# Patient Record
Sex: Female | Born: 1995 | Race: White | Hispanic: No | Marital: Single | Smoking: Never smoker
Health system: Southern US, Community
[De-identification: ages and names within clinical notes are randomized; demographics above are authoritative.]

## PROBLEM LIST (undated history)

## (undated) DIAGNOSIS — D690 Allergic purpura: Secondary | ICD-10-CM

## (undated) HISTORY — DX: Allergic purpura: D69.0

## (undated) HISTORY — PX: RENAL BIOPSY: SHX156

---
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
## 2017-01-16 ENCOUNTER — Encounter: Payer: Self-pay | Admitting: Obstetrics and Gynecology

## 2017-01-16 ENCOUNTER — Ambulatory Visit (INDEPENDENT_AMBULATORY_CARE_PROVIDER_SITE_OTHER): Payer: Managed Care, Other (non HMO) | Admitting: Obstetrics and Gynecology

## 2017-01-16 VITALS — BP 92/60 | HR 80 | Resp 16 | Ht 61.5 in | Wt 133.0 lb

## 2017-01-16 DIAGNOSIS — N946 Dysmenorrhea, unspecified: Secondary | ICD-10-CM

## 2017-01-16 DIAGNOSIS — Z3009 Encounter for other general counseling and advice on contraception: Secondary | ICD-10-CM

## 2017-01-16 MED ORDER — LEVONORGESTREL-ETHINYL ESTRAD 0.15-30 MG-MCG PO TABS: 1.0000 | ORAL_TABLET | Freq: Every day | ORAL | 3 refills | Status: DC

## 2017-01-16 NOTE — Progress Notes (Signed)
21 y.o. G0P0000 Single Caucasian female here as a new patient for annual exam and discuss birth control. Patient was taking Lutera - levonogestrel/ethinyl estradiol. (Alesse) This was prescribed by Dr. Jeanella Craze in [place].   Mother present for part of the discussion today.  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  There was a suspicion for PCOS.  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  Had ankle swelling, rash and joint pain.   Menses regular with OCPs. Still had some cramping. This was manageable. Had breakthrough bleeding when she took the pills continuously. Cycles were otherwise 21 - 81 days.  Unable to take NSAIDs due to he HSP.   Never sexually active but wants to be prepared when she goes off to college.  FH atrial fibrillation.   Sr. In HS.  Interested in Doctor, general practice.   PCP:  Dr. Loyola Mast - [facility]  Patient's last menstrual period was 12/25/2016.     Period Cycle (Days):  (irregular periods before taking Lutera OCP) Period Duration (Days): 6 Period Pattern: (!) Irregular Menstrual Flow: Moderate Menstrual Control: Panty liner, Tampon Menstrual Control Change Freq (Hours): 4-5 Dysmenorrhea: (!) Moderate     Sexually active: No.  The current method of family planning is abstinence.  Never sexually active.  Exercising: Yes.    gym Smoker:  no  Health Maintenance: Pap:  n/a History of abnormal Pap:  n/a MMG:  n/a Colonoscopy:  n/a BMD:   n/a TDaP:  UTD per patient Gardasil:   No.  Received the first dosage only as her HSP was diagnosed around the same time after the first dosage.    Screening Labs:  Discuss today Urine today: dirty urine obtained   reports that she has never smoked. She has never used smokeless tobacco. She reports that she does not drink alcohol or use drugs.  Past Medical History:  Diagnosis Date  . HSP (Henoch Schonlein purpura) (HCC)      Past Surgical History:  Procedure Laterality Date  . RENAL BIOPSY Left     Current Outpatient Prescriptions  Medication Sig Dispense Refill  . lisinopril (PRINIVIL,ZESTRIL) 2.5 MG tablet Take 1 tablet by mouth daily.    . Multiple Vitamin (MULTIVITAMIN) capsule Take 1 capsule by mouth daily.    . Omega-3 Fatty Acids (FISH OIL OMEGA-3 PO) Take 1,000 mg by mouth daily.     No current facility-administered medications for this visit.     Family History  Problem Relation Age of Onset  . Atrial fibrillation Father   . Atrial fibrillation Paternal Grandmother     ROS:  Pertinent items are noted in HPI.  Otherwise, a comprehensive ROS was negative.  Exam:   BP 92/60 (BP Location: Right Arm, Patient Position: Sitting, Cuff Size: Normal)   Pulse 80   Resp 16   Ht 5' 1.5" (1.562 m)   Wt 133 lb (60.3 kg)   LMP 12/25/2016   BMI 24.72 kg/m     General appearance: alert, cooperative and appears stated age Head: Normocephalic, without obvious abnormality, atraumatic Neck: no adenopathy, supple, symmetrical, trachea midline and thyroid normal to inspection and palpation Lungs: clear to auscultation bilaterally Heart: regular rate and rhythm Abdomen: soft, non-tender; no masses, no organomegaly Extremities:  No edema   Pelvic: Deferred.   Chaperone was present for exam.  Assessment:     Dysmenorrhea.  Contraceptive needs for future.  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    Plan:  Discussion of contraceptive options  - IUDs, Nexplanon, Depo Provera, NuvaRing, OCPs - including continuous. Will switch to Nordette x 3 months to try to control dysmenorrhea better.  I discussed warning signs and risks of DVT, PE, MI and stroke.  Follow up 3 months.  Discussed condom use for STD prevention when she is ready to become sexually active.    __30_____ minutes face to face time of which over 50% was spent in counseling.    After visit summary  provided.

## 2017-04-04 NOTE — Progress Notes (Addendum)
GYNECOLOGY  VISIT   HPI: 21 y.o.   Single  Caucasian  female   G0P0000 with Patient's last menstrual period was 03/20/2017.   here for 3 month recheck; patient wants to discuss other birth control options; patient complains of cysts on outside of vagina.   They feel uncomfortable.  States they rupture. Used to have these under her arm as well.  Used abx in the past which has not worked well.  Used chlorhexadine in the past.  Has seen a surgeon as well, but she did not follow through with this.   On Nordette for dysmenorrhea control. Does not need NSAIDs. No missed pills.  Has had oral sex with her partner.  They are first partners ever.  Going to [place] this fall.   Nephrologist - Dr. Imogene Burnhen and [facility]   Works out at the [employer] - swimming, [employer].  Thinks she is lactose intolerant.   Trying Lactaide.  GYNECOLOGIC HISTORY: Patient's last menstrual period was 03/20/2017. Contraception:  OCP Menopausal hormone therapy:  n/a Last mammogram:  n/a Last pap smear:   N/a Gardasil:  Developed HSP following this first injection, so she did not complete the series.         OB History    Gravida Para Term Preterm AB Living   0 0 0 0 0 0   SAB TAB Ectopic Multiple Live Births   0 0 0 0 0         There are no active problems to display for this patient.   Past Medical History:  Diagnosis Date  . HSP (Henoch Schonlein purpura) (HCC)     Past Surgical History:  Procedure Laterality Date  . RENAL BIOPSY Left     Current Outpatient Prescriptions  Medication Sig Dispense Refill  . Inulin-Cholecalciferol (FIBER/D3 ADULT GUMMIES PO) Take by mouth.    . levonorgestrel-ethinyl estradiol (NORDETTE) 0.15-30 MG-MCG tablet Take 1 tablet by mouth daily. 1 Package 3  . lisinopril (PRINIVIL,ZESTRIL) 2.5 MG tablet Take 1 tablet by mouth daily.    . Multiple Vitamin (MULTIVITAMIN) capsule Take 1 capsule by mouth daily.    . Omega-3 Fatty Acids (FISH OIL OMEGA-3 PO) Take  1,000 mg by mouth daily.     No current facility-administered medications for this visit.      ALLERGIES: Ibuprofen  Family History  Problem Relation Age of Onset  . Atrial fibrillation Father   . Atrial fibrillation Paternal Grandmother     Social History   Social History  . Marital status: Single    Spouse name: N/A  . Number of children: N/A  . Years of education: N/A   Occupational History  . Not on file.   Social History Main Topics  . Smoking status: Never Smoker  . Smokeless tobacco: Never Used  . Alcohol use No  . Drug use: No  . Sexual activity: No   Other Topics Concern  . Not on file   Social History Narrative  . No narrative on file    ROS:  Pertinent items are noted in HPI.  PHYSICAL EXAMINATION:    BP 98/60 (BP Location: Right Arm, Patient Position: Sitting, Cuff Size: Normal)   Pulse 80   Resp 16   Wt 133 lb (60.3 kg)   LMP 03/20/2017   BMI 24.72 kg/m     General appearance: alert, cooperative and appears stated age Head: Normocephalic, without obvious abnormality, atraumatic Neck: no adenopathy, supple, symmetrical, trachea midline and thyroid normal  to inspection and palpation Lungs: clear to auscultation bilaterally Breasts: normal appearance, no masses or tenderness, No nipple retraction or dimpling, No nipple discharge or bleeding, No axillary or supraclavicular adenopathy Heart: regular rate and rhythm Abdomen: soft, non-tender, no masses,  no organomegaly Extremities: extremities normal, atraumatic, no cyanosis or edema Skin: Skin color, texture, turgor normal. No rashes or lesions Lymph nodes: Cervical, supraclavicular, and axillary nodes normal. No abnormal inguinal nodes palpated Neurologic: Grossly normal  Pelvic: External genitalia:  Left groin with two 0.7 cm firm nodules, one with small amount of pus drainage.  Wound culture taken. Chaperone was present for exam.  ASSESSMENT  Hx dysmenorrhea. Controlled on combined  OCPs. Hx PCOS by hx.  Henock Schonlein purpura. Hidradenitis suppurativa.  Chronic.   PLAN  Wound culture.  Patient will contact her surgeon about potential excision of groin masses. Cannot take bactrim DS or doxycycline, going to the beach.  Clindamycin lotion 1% to area bid.  Self breast exam taught.  OCP refill for one year.  Discussed STD prevention. Follow up yearly and prn.    An After Visit Summary was printed and given to the patient.

## 2017-04-05 ENCOUNTER — Encounter: Payer: Self-pay | Admitting: Obstetrics and Gynecology

## 2017-04-05 ENCOUNTER — Ambulatory Visit (INDEPENDENT_AMBULATORY_CARE_PROVIDER_SITE_OTHER): Payer: Managed Care, Other (non HMO) | Admitting: Obstetrics and Gynecology

## 2017-04-05 VITALS — BP 98/60 | HR 80 | Resp 16 | Wt 133.0 lb

## 2017-04-05 DIAGNOSIS — Z01419 Encounter for gynecological examination (general) (routine) without abnormal findings: Secondary | ICD-10-CM

## 2017-04-05 DIAGNOSIS — L732 Hidradenitis suppurativa: Secondary | ICD-10-CM | POA: Diagnosis not present

## 2017-04-05 MED ORDER — LEVONORGESTREL-ETHINYL ESTRAD 0.15-30 MG-MCG PO TABS: 1.0000 | ORAL_TABLET | Freq: Every day | ORAL | 3 refills | Status: DC

## 2017-04-05 MED ORDER — CLINDAMYCIN PHOSPHATE 1 % EX SOLN: Freq: Two times a day (BID) | CUTANEOUS | 0 refills | Status: DC

## 2017-04-05 NOTE — Patient Instructions (Signed)

## 2017-04-08 LAB — WOUND CULTURE: ORGANISM ID, BACTERIA: NONE SEEN

## 2017-04-11 ENCOUNTER — Telehealth: Payer: Self-pay

## 2017-04-11 NOTE — Telephone Encounter (Signed)
Spoke with patient. Advised of message and results as seen below from Dr.Silva. Patient is agreeable and verbalizes understanding. Would like to think about surgical removal and will call general surgeon if she decides to move forward.  Routing to provider for final review. Patient agreeable to disposition. Will close encounter.

## 2017-04-11 NOTE — Telephone Encounter (Signed)
-----   Message from Patton SallesBrook E Amundson C Silva, MD sent at 04/10/2017  7:20 PM EDT ----- Please inform patient that the final culture of the area on her thigh is negative or infection.  She can use the Clindamycin lotion, but I still believe that the surgical excision with her general surgeon will result in the most effective treatment.

## 2017-04-24 ENCOUNTER — Ambulatory Visit: Payer: Managed Care, Other (non HMO) | Admitting: Obstetrics and Gynecology

## 2017-06-19 ENCOUNTER — Telehealth: Payer: Self-pay | Admitting: Obstetrics and Gynecology

## 2017-06-19 NOTE — Telephone Encounter (Signed)
Patient calling requesting a refill on her birth control. Pharmacy on file is correct. She said she was to report to the nurse after 3 months and she is "doing well."

## 2017-06-19 NOTE — Telephone Encounter (Signed)
Spoke with patient, confirmed taking OCP Nordette. Advised patient OCP previously sent on 04/05/17 with 3 refills, [facility]. Patient verbalizes understanding and is agreeable.   Routing to provider for final review. Patient is agreeable to disposition. Will close encounter.

## 2017-08-14 DIAGNOSIS — Z23 Encounter for immunization: Secondary | ICD-10-CM | POA: Diagnosis not present

## 2017-09-24 ENCOUNTER — Other Ambulatory Visit: Payer: Self-pay | Admitting: Obstetrics and Gynecology

## 2017-09-24 NOTE — Telephone Encounter (Signed)
Called pharmacy, patient has 2 refills remaining. Requested for RX to be filled for patient. Left patient a message advising refills have been done -eh

## 2017-09-24 NOTE — Telephone Encounter (Signed)
[facility] a [facility] on [phone].

## 2017-09-25 DIAGNOSIS — N058 Unspecified nephritic syndrome with other morphologic changes: Secondary | ICD-10-CM | POA: Diagnosis not present

## 2017-09-25 DIAGNOSIS — R3129 Other microscopic hematuria: Secondary | ICD-10-CM | POA: Diagnosis not present

## 2017-10-23 DIAGNOSIS — N058 Unspecified nephritic syndrome with other morphologic changes: Secondary | ICD-10-CM | POA: Diagnosis not present

## 2017-10-23 DIAGNOSIS — R809 Proteinuria, unspecified: Secondary | ICD-10-CM | POA: Diagnosis not present

## 2018-01-18 DIAGNOSIS — H6692 Otitis media, unspecified, left ear: Secondary | ICD-10-CM | POA: Diagnosis not present

## 2018-01-18 DIAGNOSIS — J019 Acute sinusitis, unspecified: Secondary | ICD-10-CM | POA: Diagnosis not present

## 2018-01-21 ENCOUNTER — Telehealth: Payer: Self-pay | Admitting: Obstetrics and Gynecology

## 2018-01-21 NOTE — Telephone Encounter (Signed)
Patient needs renewal on her birth control. Has annual scheduled for 04/09/18.

## 2018-01-21 NOTE — Telephone Encounter (Signed)
Spoke with patient. Patient states she will run out of OCP, request refills and to schedule AEX.  Advised patient previously scheduled for AEX on 04/09/18 at 8am with Dr. Edward JollySilva, will keep as scheduled. F/u with pharmacy to refill OCP, last sent on 04/05/17 #3pkg/3RF to [facility]. Return call to office if any further assistance needed. Patient verbalizes understanding.   Routing to provider for final review. Patient is agreeable to disposition. Will close encounter.

## 2018-03-04 ENCOUNTER — Other Ambulatory Visit: Payer: Self-pay | Admitting: Obstetrics and Gynecology

## 2018-03-10 ENCOUNTER — Telehealth: Payer: Self-pay | Admitting: Obstetrics and Gynecology

## 2018-03-10 ENCOUNTER — Other Ambulatory Visit: Payer: Self-pay | Admitting: Obstetrics and Gynecology

## 2018-03-10 NOTE — Telephone Encounter (Signed)
Medication refill request: al[place]a Last AEX:  04/05/17 BS Next AEX: 04/09/18  Last MMG (if hormonal medication request): n/a Refill authorized: 04/05/17 3packs, 3RF. Today, please advise.

## 2018-04-06 ENCOUNTER — Other Ambulatory Visit: Payer: Self-pay | Admitting: Obstetrics and Gynecology

## 2018-04-07 NOTE — Telephone Encounter (Signed)
Medication refill request: OCP  Last AEX:  04-05-17  Next AEX: 04-09-18  Last MMG (if hormonal medication request): N/A Refill authorized: please advise

## 2018-04-09 ENCOUNTER — Other Ambulatory Visit: Payer: Self-pay

## 2018-04-09 ENCOUNTER — Ambulatory Visit: Payer: BLUE CROSS/BLUE SHIELD | Admitting: Obstetrics and Gynecology

## 2018-04-09 ENCOUNTER — Encounter: Payer: Self-pay | Admitting: Obstetrics and Gynecology

## 2018-04-09 VITALS — BP 116/56 | HR 88 | Resp 16 | Ht 62.0 in | Wt 137.0 lb

## 2018-04-09 DIAGNOSIS — Z01419 Encounter for gynecological examination (general) (routine) without abnormal findings: Secondary | ICD-10-CM | POA: Diagnosis not present

## 2018-04-09 DIAGNOSIS — Z113 Encounter for screening for infections with a predominantly sexual mode of transmission: Secondary | ICD-10-CM | POA: Diagnosis not present

## 2018-04-09 MED ORDER — LEVONORGESTREL-ETHINYL ESTRAD 0.15-30 MG-MCG PO TABS: 1.0000 | ORAL_TABLET | Freq: Every day | ORAL | 3 refills | Status: DC

## 2018-04-09 NOTE — Patient Instructions (Signed)

## 2018-04-09 NOTE — Progress Notes (Signed)
22 y.o. 540P0000 Single Caucasian female here for annual exam.    Happy with OCPs.  Cramping is much better but occasionally uses Tylenol. Sexually active once.   Had some respiratory wheezing this year and was tx with an inhaler, nasal spray, and decongestant.   PCP: No PCP     Patient's last menstrual period was 03/19/2018.           Sexually active: Yes.    The current method of family planning is OCP (estrogen/progesterone).    Exercising: No.  The patient does not participate in regular exercise at present. Smoker:  no  Health Maintenance: Pap:  N/a due to age History of abnormal Pap:  n/a TDaP:  Up to date per patient Gardasil:   Received the first dosage only as her HSP was diagnosed around the same time after the first dosage.  Screening Labs:  Discuss today   reports that she has never smoked. She has never used smokeless tobacco. She reports that she does not drink alcohol or use drugs.  Past Medical History:  Diagnosis Date  . HSP (Henoch Schonlein purpura) (HCC)     Past Surgical History:  Procedure Laterality Date  . RENAL BIOPSY Left     Current Outpatient Medications  Medication Sig Dispense Refill  . KURVELO 0.15-30 MG-MCG tablet TAKE 1 TABLET BY MOUTH DAILY 28 tablet 0  . Multiple Vitamin (MULTIVITAMIN) capsule Take 1 capsule by mouth daily.     No current facility-administered medications for this visit.     Family History  Problem Relation Age of Onset  . Atrial fibrillation Father   . Atrial fibrillation Paternal Grandmother     Review of Systems  Constitutional: Negative.   HENT: Negative.   Eyes: Negative.   Respiratory: Negative.   Cardiovascular: Negative.   Gastrointestinal: Negative.   Endocrine: Negative.   Genitourinary: Negative.   Musculoskeletal: Negative.   Skin: Negative.   Allergic/Immunologic: Negative.   Neurological: Negative.   Hematological: Negative.   Psychiatric/Behavioral: Negative.     Exam:   BP (!)  116/56 (BP Location: Right Arm, Patient Position: Sitting, Cuff Size: Normal)   Pulse 88   Resp 16   Ht 5\' 2"  (1.575 m)   Wt 137 lb (62.1 kg)   LMP 03/19/2018   BMI 25.06 kg/m     General appearance: alert, cooperative and appears stated age Head: Normocephalic, without obvious abnormality, atraumatic Neck: no adenopathy, supple, symmetrical, trachea midline and thyroid normal to inspection and palpation Lungs: clear to auscultation bilaterally Breasts: normal appearance, no masses or tenderness, No nipple retraction or dimpling, No nipple discharge or bleeding, No axillary or supraclavicular adenopathy Heart: regular rate and rhythm Abdomen: soft, non-tender; no masses, no organomegaly Extremities: extremities normal, atraumatic, no cyanosis or edema Skin: Skin color, texture, turgor normal. No rashes or lesions Lymph nodes: Cervical, supraclavicular, and axillary nodes normal. No abnormal inguinal nodes palpated Neurologic: Grossly normal  Pelvic:  Declined.   Chaperone was present for exam.  Assessment:   Well woman visit with normal exam. Hx dysmenorrhea. Controlled on combined OCPs. Hx PCOS by hx.  Henock Schonlein purpura. Hidradenitis suppurativa.  Chronic.   Plan: Mammogram screening age 22. Recommended self breast awareness. Pap and HR HPV as above. Guidelines for Calcium, Vitamin D, regular exercise program including cardiovascular and weight bearing exercise. STD screening.  Discussed condom use.  Refill of OCPs for one year.  Follow up annually and prn.   After visit summary provided.

## 2018-04-10 LAB — GC/CHLAMYDIA PROBE AMP
Chlamydia trachomatis, NAA: NEGATIVE
NEISSERIA GONORRHOEAE BY PCR: NEGATIVE

## 2018-04-10 LAB — HEP, RPR, HIV PANEL
HIV SCREEN 4TH GENERATION: NONREACTIVE
Hepatitis B Surface Ag: NEGATIVE
RPR Ser Ql: NONREACTIVE

## 2018-04-10 LAB — HEPATITIS C ANTIBODY: Hep C Virus Ab: <0.1 s/co ratio (ref 0.0–0.9)

## 2018-04-23 DIAGNOSIS — J069 Acute upper respiratory infection, unspecified: Secondary | ICD-10-CM | POA: Diagnosis not present

## 2018-04-23 DIAGNOSIS — R809 Proteinuria, unspecified: Secondary | ICD-10-CM | POA: Diagnosis not present

## 2018-04-23 DIAGNOSIS — N08 Glomerular disorders in diseases classified elsewhere: Secondary | ICD-10-CM | POA: Diagnosis not present

## 2018-04-23 DIAGNOSIS — J029 Acute pharyngitis, unspecified: Secondary | ICD-10-CM | POA: Diagnosis not present

## 2018-04-23 DIAGNOSIS — D690 Allergic purpura: Secondary | ICD-10-CM | POA: Diagnosis not present

## 2018-06-12 ENCOUNTER — Telehealth: Payer: Self-pay | Admitting: Obstetrics and Gynecology

## 2018-06-12 NOTE — Telephone Encounter (Signed)
Left message for patient to reschedule appointment.

## 2018-08-14 DIAGNOSIS — Z6823 Body mass index (BMI) 23.0-23.9, adult: Secondary | ICD-10-CM | POA: Diagnosis not present

## 2018-08-14 DIAGNOSIS — Z1389 Encounter for screening for other disorder: Secondary | ICD-10-CM | POA: Diagnosis not present

## 2018-08-14 DIAGNOSIS — Z23 Encounter for immunization: Secondary | ICD-10-CM | POA: Diagnosis not present

## 2018-08-14 DIAGNOSIS — D690 Allergic purpura: Secondary | ICD-10-CM | POA: Diagnosis not present

## 2018-08-14 DIAGNOSIS — J302 Other seasonal allergic rhinitis: Secondary | ICD-10-CM | POA: Diagnosis not present

## 2018-08-29 ENCOUNTER — Other Ambulatory Visit: Payer: Self-pay | Admitting: Obstetrics and Gynecology

## 2018-08-29 NOTE — Telephone Encounter (Signed)
Medication refill request: Suzanne Avery  Last AEX:  04/09/18 Next AEX: 04/17/19 Last MMG (if hormonal medication request): NA Refill authorized: #[id number] with 8 RF

## 2018-10-22 DIAGNOSIS — N08 Glomerular disorders in diseases classified elsewhere: Secondary | ICD-10-CM | POA: Diagnosis not present

## 2018-10-22 DIAGNOSIS — R809 Proteinuria, unspecified: Secondary | ICD-10-CM | POA: Diagnosis not present

## 2018-10-22 DIAGNOSIS — D690 Allergic purpura: Secondary | ICD-10-CM | POA: Diagnosis not present

## 2018-10-22 DIAGNOSIS — J302 Other seasonal allergic rhinitis: Secondary | ICD-10-CM | POA: Diagnosis not present

## 2019-01-21 DIAGNOSIS — N390 Urinary tract infection, site not specified: Secondary | ICD-10-CM | POA: Diagnosis not present

## 2019-01-21 DIAGNOSIS — R300 Dysuria: Secondary | ICD-10-CM | POA: Diagnosis not present

## 2019-04-06 ENCOUNTER — Telehealth: Payer: Self-pay | Admitting: Obstetrics and Gynecology

## 2019-04-06 NOTE — Telephone Encounter (Signed)
Left message on voicemail to call and reschedule cancelled appointment. °

## 2019-04-08 DIAGNOSIS — N390 Urinary tract infection, site not specified: Secondary | ICD-10-CM | POA: Diagnosis not present

## 2019-04-08 DIAGNOSIS — R300 Dysuria: Secondary | ICD-10-CM | POA: Diagnosis not present

## 2019-04-16 ENCOUNTER — Ambulatory Visit: Payer: BLUE CROSS/BLUE SHIELD | Admitting: Obstetrics and Gynecology

## 2019-04-17 ENCOUNTER — Ambulatory Visit: Payer: BLUE CROSS/BLUE SHIELD | Admitting: Obstetrics and Gynecology

## 2019-04-22 DIAGNOSIS — N390 Urinary tract infection, site not specified: Secondary | ICD-10-CM | POA: Diagnosis not present

## 2019-04-28 ENCOUNTER — Other Ambulatory Visit: Payer: Self-pay | Admitting: Obstetrics and Gynecology

## 2019-04-28 NOTE — Telephone Encounter (Signed)
Call to patient, left detailed message, ok per dpr. Advised requested RX has been sent to [facility], f/u with pharmacy for filling. Return call to office if any additional questions. Encounter closed.

## 2019-04-28 NOTE — Telephone Encounter (Signed)
Patient is out of birth control and needs refill to last until aex scheduled 05/13/19.

## 2019-04-28 NOTE — Telephone Encounter (Signed)
Med refill request: Fortunato Curling 0.15-30 mg-mcg tab Last AEX: 04/09/18 Next AEX: 05/13/19 Last Rx sent on 08/31/18 #28/8RF  Rx pended for [place] #28 tabs/0RF  Routing to Dr. Quincy Simmonds to review and advise.

## 2019-04-29 DIAGNOSIS — R319 Hematuria, unspecified: Secondary | ICD-10-CM | POA: Diagnosis not present

## 2019-04-29 DIAGNOSIS — D690 Allergic purpura: Secondary | ICD-10-CM | POA: Diagnosis not present

## 2019-04-29 DIAGNOSIS — R109 Unspecified abdominal pain: Secondary | ICD-10-CM | POA: Diagnosis not present

## 2019-04-29 DIAGNOSIS — K5900 Constipation, unspecified: Secondary | ICD-10-CM | POA: Diagnosis not present

## 2019-04-29 DIAGNOSIS — N08 Glomerular disorders in diseases classified elsewhere: Secondary | ICD-10-CM | POA: Diagnosis not present

## 2019-05-11 ENCOUNTER — Other Ambulatory Visit: Payer: Self-pay

## 2019-05-13 ENCOUNTER — Other Ambulatory Visit (HOSPITAL_COMMUNITY)
Admission: RE | Admit: 2019-05-13 | Discharge: 2019-05-13 | Disposition: A | Discharge status: Home / Self Care | Payer: BC Managed Care – PPO | Source: Ambulatory Visit | Attending: Obstetrics and Gynecology | Admitting: Obstetrics and Gynecology

## 2019-05-13 ENCOUNTER — Ambulatory Visit: Payer: BC Managed Care – PPO | Admitting: Obstetrics and Gynecology

## 2019-05-13 ENCOUNTER — Other Ambulatory Visit: Payer: Self-pay

## 2019-05-13 ENCOUNTER — Encounter: Payer: Self-pay | Admitting: Obstetrics and Gynecology

## 2019-05-13 VITALS — BP 108/60 | HR 88 | Temp 97.4°F | Resp 12 | Ht 61.75 in | Wt 139.0 lb

## 2019-05-13 DIAGNOSIS — Z113 Encounter for screening for infections with a predominantly sexual mode of transmission: Secondary | ICD-10-CM | POA: Insufficient documentation

## 2019-05-13 DIAGNOSIS — Z01419 Encounter for gynecological examination (general) (routine) without abnormal findings: Secondary | ICD-10-CM | POA: Diagnosis not present

## 2019-05-13 MED ORDER — LEVONORGESTREL-ETHINYL ESTRAD 0.15-30 MG-MCG PO TABS: 1.0000 | ORAL_TABLET | Freq: Every day | ORAL | 3 refills | Status: DC

## 2019-05-13 NOTE — Progress Notes (Signed)
23 y.o. G0P0000 Single Caucasian female here for annual exam.    On OCPs.  Remembers them.  Wants to continue.   She had blood testing with her urologist.  Student at [employer].   PCP:   Dr. Osborne Casco  Patient's last menstrual period was 05/02/2019.           Sexually active: Yes.    The current method of family planning is OCP (estrogen/progesterone).    Exercising: No.  The patient does not participate in regular exercise at present. Smoker:  no  Health Maintenance: Pap:  never History of abnormal Pap:  n/a TDaP:  UTD Gardasil:   Received the first dosage only as her HSP was diagnosed around the same time after the first dosage HIV and Hep C: 04/09/18 Negative Screening Labs:  none   reports that she has never smoked. She has never used smokeless tobacco. She reports current alcohol use. She reports that she does not use drugs.  Past Medical History:  Diagnosis Date  . HSP (Henoch Schonlein purpura) (HCC)     Past Surgical History:  Procedure Laterality Date  . RENAL BIOPSY Left     Current Outpatient Medications  Medication Sig Dispense Refill  . Ascorbic Acid (VITAMIN C) 100 MG tablet Take 100 mg by mouth daily.    . Biotin 1 MG CAPS Take by mouth.    Marland Kitchen KURVELO 0.15-30 MG-MCG tablet TAKE 1 TABLET BY MOUTH DAILY 28 tablet 0  . LACTOBACILLUS PROBIOTIC PO Take by mouth.    . Multiple Vitamin (MULTIVITAMIN) capsule Take 1 capsule by mouth daily.     No current facility-administered medications for this visit.     Family History  Problem Relation Age of Onset  . Atrial fibrillation Father   . Atrial fibrillation Paternal Grandmother     Review of Systems  Constitutional: Negative.   HENT: Negative.   Eyes: Negative.   Respiratory: Negative.   Cardiovascular: Negative.   Gastrointestinal: Negative.   Endocrine: Negative.   Genitourinary: Negative.   Musculoskeletal: Negative.   Skin: Negative.   Allergic/Immunologic: Negative.   Neurological: Negative.    Hematological: Negative.   Psychiatric/Behavioral: Negative.     Exam:   BP 108/60 (BP Location: Left Arm, Patient Position: Sitting, Cuff Size: Normal)   Pulse 88   Temp (!) 97.4 F (36.3 C) (Temporal)   Resp 12   Ht 5' 1.75" (1.568 m)   Wt 139 lb (63 kg)   LMP 05/02/2019   BMI 25.63 kg/m     General appearance: alert, cooperative and appears stated age Head: normocephalic, without obvious abnormality, atraumatic Neck: no adenopathy, supple, symmetrical, trachea midline and thyroid normal to inspection and palpation Lungs: clear to auscultation bilaterally Breasts: normal appearance, no masses or tenderness, No nipple retraction or dimpling, No nipple discharge or bleeding, No axillary adenopathy Heart: regular rate and rhythm Abdomen: soft, non-tender; no masses, no organomegaly Extremities: extremities normal, atraumatic, no cyanosis or edema Skin: skin color, texture, turgor normal. No rashes or lesions Lymph nodes: cervical, supraclavicular, and axillary nodes normal. Neurologic: grossly normal  Pelvic: External genitalia:   Few boils on vulva.              No abnormal inguinal nodes palpated.              Urethra:  normal appearing urethra with no masses, tenderness or lesions              Bartholins and Skenes: normal  Vagina: normal appearing vagina with normal color and discharge, no lesions              Cervix: no lesions              Pap taken: Yes.   Bimanual Exam:  Uterus:  normal size, contour, position, consistency, mobility, non-tender              Adnexa: no mass, fullness, tenderness                      Chaperone was present for exam.  Assessment:   Well woman visit with normal exam. Hx dysmenorrhea. Controlled on combined OCPs. Hx PCOS by hx.  Henock Schonlein purpura. Hidradenitis suppurativa.Chronic.  Plan: Mammogram screening discussed. Self breast awareness reviewed. Pap and HR HPV as above. Guidelines for Calcium,  Vitamin D, regular exercise program including cardiovascular and weight bearing exercise. STD testing.  FU with dermatology.   Follow up annually and prn.    After visit summary provided.

## 2019-05-13 NOTE — Patient Instructions (Addendum)
EXERCISE AND DIET:  We recommended that you start or continue a regular exercise program for good health. Regular exercise means any activity that makes your heart beat faster and makes you sweat.  We recommend exercising at least 30 minutes per day at least 3 days a week, preferably 4 or 5.  We also recommend a diet low in fat and sugar.  Inactivity, poor dietary choices and obesity can cause diabetes, heart attack, stroke, and kidney damage, among others.    ALCOHOL AND SMOKING:  Women should limit their alcohol intake to no more than 7 drinks/beers/glasses of wine (combined, not each!) per week. Moderation of alcohol intake to this level decreases your risk of breast cancer and liver damage. And of course, no recreational drugs are part of a healthy lifestyle.  And absolutely no smoking or even second hand smoke. Most people know smoking can cause heart and lung diseases, but did you know it also contributes to weakening of your bones? Aging of your skin?  Yellowing of your teeth and nails?  CALCIUM AND VITAMIN D:  Adequate intake of calcium and Vitamin D are recommended.  The recommendations for exact amounts of these supplements seem to change often, but generally speaking 600 mg of calcium (either carbonate or citrate) and 800 units of Vitamin D per day seems prudent. Certain women may benefit from higher intake of Vitamin D.  If you are among these women, your doctor will have told you during your visit.    PAP SMEARS:  Pap smears, to check for cervical cancer or precancers,  have traditionally been done yearly, although recent scientific advances have shown that most women can have pap smears less often.  However, every woman still should have a physical exam from her gynecologist every year. It will include a breast check, inspection of the vulva and vagina to check for abnormal growths or skin changes, a visual exam of the cervix, and then an exam to evaluate the size and shape of the uterus and  ovaries.  And after 21 years of age, a rectal exam is indicated to check for rectal cancers. We will also provide age appropriate advice regarding health maintenance, like when you should have certain vaccines, screening for sexually transmitted diseases, bone density testing, colonoscopy, mammograms, etc.   MAMMOGRAMS:  All women over 40 years old should have a yearly mammogram. Many facilities now offer a "3D" mammogram, which may cost around $50 extra out of pocket. If possible,  we recommend you accept the option to have the 3D mammogram performed.  It both reduces the number of women who will be called back for extra views which then turn out to be normal, and it is better than the routine mammogram at detecting truly abnormal areas.    COLONOSCOPY:  Colonoscopy to screen for colon cancer is recommended for all women at age 50.  We know, you hate the idea of the prep.  We agree, BUT, having colon cancer and not knowing it is worse!!  Colon cancer so often starts as a polyp that can be seen and removed at colonscopy, which can quite literally save your life!  And if your first colonoscopy is normal and you have no family history of colon cancer, most women don't have to have it again for 10 years.  Once every ten years, you can do something that may end up saving your life, right?  We will be happy to help you get it scheduled when you are ready.    Be sure to check your insurance coverage so you understand how much it will cost.  It may be covered as a preventative service at no cost, but you should check your particular policy.      Hidradenitis Suppurativa Hidradenitis suppurativa is a long-term (chronic) skin disease. It is similar to a severe form of acne, but it affects areas of the body where acne would be unusual, especially areas of the body where skin rubs against skin and becomes moist. These include:  Underarms.  Groin.  Genital area.  Buttocks.  Upper  thighs.  Breasts. Hidradenitis suppurativa may start out as small lumps or pimples caused by blocked sweat glands or hair follicles. Pimples may develop into deep sores that break open (rupture) and drain pus. Over time, affected areas of skin may thicken and become scarred. This condition is rare and does not spread from person to person (non-contagious). What are the causes? The exact cause of this condition is not known. It may be related to:  Female and female hormones.  An overactive disease-fighting system (immune system). The immune system may over-react to blocked hair follicles or sweat glands and cause swelling and pus-filled sores. What increases the risk? You are more likely to develop this condition if you:  Are female.  Are 49-35 years old.  Have a family history of hidradenitis suppurativa.  Have a personal history of acne.  Are overweight.  Smoke.  Take the medicine lithium. What are the signs or symptoms? The first symptoms are usually painful bumps in the skin, similar to pimples. The condition may get worse over time (progress), or it may only cause mild symptoms. If the disease progresses, symptoms may include:  Skin bumps getting bigger and growing deeper into the skin.  Bumps rupturing and draining pus.  Itchy, infected skin.  Skin getting thicker and scarred.  Tunnels under the skin (fistulas) where pus drains from a bump.  Pain during daily activities, such as pain during walking if your groin area is affected.  Emotional problems, such as stress or depression. This condition may affect your appearance and your ability or willingness to wear certain clothes or do certain activities. How is this diagnosed? This condition is diagnosed by a health care provider who specializes in skin diseases (dermatologist). You may be diagnosed based on:  Your symptoms and medical history.  A physical exam.  Testing a pus sample for infection.  Blood  tests. How is this treated? Your treatment will depend on how severe your symptoms are. The same treatment will not work for everybody with this condition. You may need to try several treatments to find what works best for you. Treatment may include:  Cleaning and bandaging (dressing) your wounds as needed.  Lifestyle changes, such as new skin care routines.  Taking medicines, such as: ? Antibiotics. ? Acne medicines. ? Medicines to reduce the activity of the immune system. ? A diabetes medicine (metformin). ? Birth control pills, for women. ? Steroids to reduce swelling and pain.  Working with a mental health care provider, if you experience emotional distress due to this condition. If you have severe symptoms that do not get better with medicine, you may need surgery. Surgery may involve:  Using a laser to clear the skin and remove hair follicles.  Opening and draining deep sores.  Removing the areas of skin that are diseased and scarred. Follow these instructions at home: Medicines   Take over-the-counter and prescription medicines only as told by your  health care provider.  If you were prescribed an antibiotic medicine, take it as told by your health care provider. Do not stop taking the antibiotic even if your condition improves. Skin care  If you have open wounds, cover them with a clean dressing as told by your health care provider. Keep wounds clean by washing them gently with soap and water when you bathe.  Do not shave the areas where you get hidradenitis suppurativa.  Do not wear deodorant.  Wear loose-fitting clothes.  Try to avoid getting overheated or sweaty. If you get sweaty or wet, change into clean, dry clothes as soon as you can.  To help relieve pain and itchiness, cover sore areas with a warm, clean washcloth (warm compress) for 5-10 minutes as often as needed.  If told by your health care provider, take a bleach bath twice a week: ? Fill your  bathtub halfway with water. ? Pour in  cup of unscented household bleach. ? Soak in the tub for 5-10 minutes. ? Only soak from the neck down. Avoid water on your face and hair. ? Shower to rinse off the bleach from your skin. General instructions  Learn as much as you can about your disease so that you have an active role in your treatment. Work closely with your health care provider to find treatments that work for you.  If you are overweight, work with your health care provider to lose weight as recommended.  Do not use any products that contain nicotine or tobacco, such as cigarettes and e-cigarettes. If you need help quitting, ask your health care provider.  If you struggle with living with this condition, talk with your health care provider or work with a mental health care provider as recommended.  Keep all follow-up visits as told by your health care provider. This is important. Where to find more information  Hidradenitis [place].: [email or web address]://www.hs-foundation.org/ Contact a health care provider if you have:  A flare-up of hidradenitis suppurativa.  A fever or chills.  Trouble controlling your symptoms at home.  Trouble doing your daily activities because of your symptoms.  Trouble dealing with emotional problems related to your condition. Summary  Hidradenitis suppurativa is a long-term (chronic) skin disease. It is similar to a severe form of acne, but it affects areas of the body where acne would be unusual.  The first symptoms are usually painful bumps in the skin, similar to pimples. The condition may get worse over time (progress), or it may only cause mild symptoms.  If you have open wounds, cover them with a clean dressing as told by your health care provider. Keep wounds clean by washing them gently with soap and water when you bathe.  Besides skin care, treatment may include medicines, laser treatment, and surgery. This information is not  intended to replace advice given to you by your health care provider. Make sure you discuss any questions you have with your health care provider. Document Released: 06/05/2004 Document Revised: 10/30/2017 Document Reviewed: 10/30/2017 Elsevier Patient Education  2020 [employer].

## 2019-05-14 LAB — LIPID PANEL
Chol/HDL Ratio: 2.9 ratio (ref 0.0–4.4)
Cholesterol, Total: 160 mg/dL (ref 100–199)
HDL: 56 mg/dL (ref >39)
LDL Calculated: 76 mg/dL (ref 0–99)
Triglycerides: 140 mg/dL (ref 0–149)
VLDL Cholesterol Cal: 28 mg/dL (ref 5–40)

## 2019-05-14 LAB — HEP, RPR, HIV PANEL
HIV Screen 4th Generation wRfx: NONREACTIVE
Hepatitis B Surface Ag: NEGATIVE
RPR Ser Ql: NONREACTIVE

## 2019-05-14 LAB — HEPATITIS C ANTIBODY: Hep C Virus Ab: <0.1 s/co ratio (ref 0.0–0.9)

## 2019-05-15 LAB — CYTOLOGY - PAP
Chlamydia: NEGATIVE
Diagnosis: NEGATIVE
Neisseria Gonorrhea: NEGATIVE
Trichomonas: NEGATIVE

## 2019-06-02 ENCOUNTER — Other Ambulatory Visit: Payer: Self-pay | Admitting: Obstetrics and Gynecology

## 2019-06-03 ENCOUNTER — Telehealth: Payer: Self-pay | Admitting: Obstetrics and Gynecology

## 2019-06-03 NOTE — Telephone Encounter (Signed)
Priscille Kluver sent on 05/13/19 #3pks/3RF  Call placed to [facility], spoke with Baxter Flattery. Confirmed RX on file for [facility].

## 2019-06-03 NOTE — Telephone Encounter (Signed)
Patient's prescription was denied by by our office. She said "I was just seen for an appointment, why was the prescription denied"?

## 2019-06-03 NOTE — Telephone Encounter (Signed)
Spoke with patient, advised as seen below per pharmacy. Patient to f/u with pharmacy for filling. Patient verbalizes understanding.  Encounter closed.

## 2019-08-11 DIAGNOSIS — Z20828 Contact with and (suspected) exposure to other viral communicable diseases: Secondary | ICD-10-CM | POA: Diagnosis not present

## 2019-09-25 DIAGNOSIS — N390 Urinary tract infection, site not specified: Secondary | ICD-10-CM | POA: Diagnosis not present

## 2020-05-01 ENCOUNTER — Other Ambulatory Visit: Payer: Self-pay | Admitting: Obstetrics and Gynecology

## 2020-05-02 NOTE — Telephone Encounter (Signed)
Medication refill request: Levora Last AEX:  05/13/19 Dr. Edward Jolly Next AEX: 05/18/20 Last MMG (if hormonal medication request): n/a Refill authorized: Please advise

## 2020-05-02 NOTE — Telephone Encounter (Signed)
Patient requesting birth control refill.

## 2020-05-17 NOTE — Progress Notes (Signed)
24 y.o. G0P0000 Single Caucasian female here for annual exam.    Happy taking birth control pills. She declines an implant form of contraception.   She has hidradenitis.  Has painful sores once a month.   One more semester in school in [place].  Wants to go to grad school to be a Doctor, general practice.   No partner change.   Had Covid in November.  Received her Pfizer Covid vaccine in March.   Father retired to [place].   PCP:  Dr.Tisovec  Patient's last menstrual period was 04/30/2020 (exact date).     Period Cycle (Days): 28 Period Duration (Days): 6 days Period Pattern: Regular Menstrual Flow: Moderate Menstrual Control: Tampon Menstrual Control Change Freq (Hours): every 5-6 hours on heaviest day Dysmenorrhea: (!) Moderate (mild to moderate -- different monthly) Dysmenorrhea Symptoms: Cramping, Diarrhea     Sexually active: Yes.    The current method of family planning is OCP (estrogen/progesterone).    Exercising: Yes.    Gym/ health club routine includes light weights. 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 She has never used smokeless tobacco. She reports current alcohol use. She reports that she does not use drugs.  Past Medical History:  Diagnosis Date  . HSP (Henoch Schonlein purpura) (HCC)     Past Surgical History:  Procedure Laterality Date  . RENAL BIOPSY Left     Current Outpatient Medications  Medication Sig Dispense Refill  . azelastine (ASTELIN) 0.1 % nasal spray Place 2 sprays into both nostrils 2 (two) times daily as needed.    . fluticasone (FLONASE) 50 MCG/ACT nasal spray Place 1 spray into both  nostrils 2 (two) times daily as needed.    Marland Kitchen LEVORA 0.15/30, 28, 0.15-30 MG-MCG tablet TAKE 1 TABLET BY MOUTH DAILY 28 tablet 0  . Multiple Vitamin (MULTIVITAMIN) capsule Take 1 capsule by mouth daily.     No current facility-administered medications for this visit.    Family History  Problem Relation Age of Onset  . Atrial fibrillation Father   . Atrial fibrillation Paternal Grandmother     Review of Systems  All other systems reviewed and are negative.   Exam:   BP 118/70   Pulse 76   Resp 20   Ht 5\' 2"  (1.575 m)   Wt 145 lb (65.8 kg)   LMP 04/30/2020 (Exact Date)   BMI 26.52 kg/m     General appearance: alert, cooperative and appears stated age Head: normocephalic, without obvious abnormality, atraumatic Neck: no adenopathy, supple, symmetrical, trachea midline and thyroid normal to inspection and palpation Lungs: clear to auscultation bilaterally Breasts: normal appearance, no masses or tenderness, No nipple retraction or dimpling, No nipple discharge or bleeding, No axillary adenopathy Heart: regular rate and rhythm Abdomen: soft, non-tender; no masses, no organomegaly Extremities: extremities normal, atraumatic, no cyanosis or edema Skin: skin color, texture, turgor normal. No rashes or lesions Lymph nodes: cervical, supraclavicular, and axillary nodes normal. Neurologic: grossly normal  Pelvic: External genitalia:  Mild scarring of the vulva.  Small cystic areas.  Small tender firm area of right labia majora  with 1 mm excoriation.                No abnormal inguinal nodes palpated.              Urethra:  normal appearing urethra with no masses, tenderness or lesions              Bartholins and Skenes: normal                 Vagina: normal appearing vagina with normal color and discharge, no lesions              Cervix: no lesions.  Friable cervix.  No CMT.              Pap taken: No. Bimanual Exam:  Uterus:  normal size, contour, position, consistency, mobility,  non-tender              Adnexa: no mass, fullness, tenderness             Chaperone was present for exam.  Assessment:   Well woman visit with normal exam. Hx dysmenorrhea. Controlled on combined OCPs. Hx PCOS by hx.  Henock Schonlein purpura. Hidradenitis suppurativa.Chronic.  Plan: Mammogram screening discussed. Self breast awareness reviewed. Pap and HR HPV in 2023.  Guidelines for Calcium, Vitamin D, regular exercise program including cardiovascular and weight bearing exercise. STD screening.  Refill of COCs. Cleocin T lotion to affected areas twice daily.  If symptoms do not improve, she can also see her dermatologist at [facility]. Follow up annually and prn.   After visit summary provided.

## 2020-05-18 ENCOUNTER — Ambulatory Visit: Payer: Managed Care, Other (non HMO) | Admitting: Obstetrics and Gynecology

## 2020-05-18 ENCOUNTER — Other Ambulatory Visit: Payer: Self-pay

## 2020-05-18 ENCOUNTER — Other Ambulatory Visit (HOSPITAL_COMMUNITY)
Admission: RE | Admit: 2020-05-18 | Discharge: 2020-05-18 | Disposition: A | Discharge status: Home / Self Care | Payer: BC Managed Care – PPO | Source: Ambulatory Visit | Attending: Obstetrics and Gynecology | Admitting: Obstetrics and Gynecology

## 2020-05-18 ENCOUNTER — Encounter: Payer: Self-pay | Admitting: Obstetrics and Gynecology

## 2020-05-18 VITALS — BP 118/70 | HR 76 | Resp 20 | Ht 62.0 in | Wt 145.0 lb

## 2020-05-18 DIAGNOSIS — Z113 Encounter for screening for infections with a predominantly sexual mode of transmission: Secondary | ICD-10-CM | POA: Diagnosis not present

## 2020-05-18 DIAGNOSIS — Z01419 Encounter for gynecological examination (general) (routine) without abnormal findings: Secondary | ICD-10-CM | POA: Diagnosis not present

## 2020-05-18 MED ORDER — LEVONORGESTREL-ETHINYL ESTRAD 0.15-30 MG-MCG PO TABS: 1.0000 | ORAL_TABLET | Freq: Every day | ORAL | 4 refills | Status: DC

## 2020-05-18 MED ORDER — CLINDAMYCIN PHOSPHATE 1 % EX SOLN: Freq: Two times a day (BID) | CUTANEOUS | 0 refills | Status: AC

## 2020-05-18 NOTE — Patient Instructions (Signed)

## 2020-05-19 LAB — CERVICOVAGINAL ANCILLARY ONLY
Chlamydia: NEGATIVE
Comment: NEGATIVE
Comment: NEGATIVE
Comment: NORMAL
Neisseria Gonorrhea: NEGATIVE
Trichomonas: NEGATIVE

## 2020-05-19 LAB — HEP, RPR, HIV PANEL
HIV Screen 4th Generation wRfx: NONREACTIVE
Hepatitis B Surface Ag: NEGATIVE
RPR Ser Ql: NONREACTIVE

## 2020-05-19 LAB — HEPATITIS C ANTIBODY: Hep C Virus Ab: <0.1 s/co ratio (ref 0.0–0.9)

## 2021-05-18 NOTE — Progress Notes (Signed)
25 y.o. G0P0000 Single Caucasian female here for annual exam as well as discuss spotting while on birth control.  Can take her birth control pill 30 - 60 minutes late and she has spotting a couple of minutes later.  Has cramps but not missing school. Is still functional.   No change in partner.  Can have some post coital bleeding.   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   PCP:   None.  LMP 6/25/202       Sexually active: Yes.    The current method of family planning is  OCP (estrogen/progesterone).    Exercising: No.   Smoker:  no  Health Maintenance: Pap:  05-13-19 Neg History of abnormal Pap:  no MMG:  n/a Colonoscopy:  n/a BMD:   n/a  Result  n/a TDaP:  up to date Gardasil:   Received the first dosage only as her HSP was diagnosed around the same time after the first dosage HIV: 05-18-20 NR Hep C:05-18-20 Neg Screening Labs:   no.   reports that she has never smoked. She has never used smokeless tobacco. She reports current alcohol use. She reports that she does not use drugs.  Past Medical History:  Diagnosis Date   HSP (Henoch Schonlein purpura) (HCC)     Past Surgical History:  Procedure Laterality Date   RENAL BIOPSY Left     Current Outpatient Medications  Medication Sig Dispense Refill   azelastine (ASTELIN) 0.1 % nasal spray Place 2 sprays into both nostrils 2 (two) times daily as needed.     clindamycin (CLEOCIN-T) 1 % external solution Apply topically 2 (two) times daily. 30 mL 0   fluticasone (FLONASE) 50 MCG/ACT nasal spray Place 1 spray into both nostrils 2 (two) times daily as needed.     levonorgestrel-ethinyl estradiol (LEVORA 0.15/30, 28,) 0.15-30 MG-MCG tablet Take 1 tablet by mouth daily. 84 tablet 4   Multiple Vitamin (MULTIVITAMIN) capsule Take 1 capsule by mouth daily.     No current facility-administered medications for this visit.    Family History  Problem Relation Age of Onset   Atrial fibrillation Father    Atrial fibrillation Paternal  Grandmother     Review of Systems  See HPI.  Exam:   BP 128/80 (BP Location: Right Arm, Patient Position: Sitting, Cuff Size: Normal)   Pulse 78   Ht 5\' 2"  (1.575 m)   Wt 150 lb (68 kg)   LMP 04/29/2021 (Approximate)   BMI 27.44 kg/m     General appearance: alert, cooperative and appears stated age Head: normocephalic, without obvious abnormality, atraumatic Neck: no adenopathy, supple, symmetrical, trachea midline and thyroid normal to inspection and palpation Lungs: clear to auscultation bilaterally Breasts: normal appearance, no masses or tenderness, No nipple retraction or dimpling, No nipple discharge or bleeding, No axillary adenopathy Heart: regular rate and rhythm Abdomen: soft, non-tender; no masses, no organomegaly Extremities: extremities normal, atraumatic, no cyanosis or edema Skin: skin color, texture, turgor normal. No rashes or lesions Lymph nodes: cervical, supraclavicular, and axillary nodes normal. Neurologic: grossly normal  Pelvic: External genitalia:  no lesions              No abnormal inguinal nodes palpated.              Urethra:  normal appearing urethra with no masses, tenderness or lesions              Bartholins and Skenes: normal  Vagina: normal appearing vagina with normal color and discharge, no lesions              Cervix: no lesions.  Friable cervix.  Ectropion noted.               Pap taken: no. Bimanual Exam:  Uterus:  normal size, contour, position, consistency, mobility, non-tender              Adnexa: no mass, fullness, tenderness          Chaperone was present for exam.  Assessment:   Well woman visit with normal exam. Hx dysmenorrhea. Controlled on combined OCPs. Postcoital spotting.  Hx PCOS by hx. Henock Schonlein purpura. Hidradenitis suppurativa.  Chronic.   Plan: Mammogram screening discussed. Self breast awareness reviewed. Pap and HR HPV as above. Guidelines for Calcium, Vitamin D, regular exercise  program including cardiovascular and weight bearing exercise. GC/CT testing.  Refill of birth control pills for one year. She may consider switching to another method next year. She will call me if she has recurrent postcoital bleeding.  Follow up annually and prn.   After visit summary provided.

## 2021-05-23 ENCOUNTER — Other Ambulatory Visit (HOSPITAL_COMMUNITY)
Admission: RE | Admit: 2021-05-23 | Discharge: 2021-05-23 | Disposition: A | Discharge status: Home / Self Care | Payer: Managed Care, Other (non HMO) | Source: Ambulatory Visit | Attending: Obstetrics and Gynecology | Admitting: Obstetrics and Gynecology

## 2021-05-23 ENCOUNTER — Other Ambulatory Visit: Payer: Self-pay

## 2021-05-23 ENCOUNTER — Ambulatory Visit (INDEPENDENT_AMBULATORY_CARE_PROVIDER_SITE_OTHER): Payer: Managed Care, Other (non HMO) | Admitting: Obstetrics and Gynecology

## 2021-05-23 ENCOUNTER — Encounter: Payer: Self-pay | Admitting: Obstetrics and Gynecology

## 2021-05-23 VITALS — BP 128/80 | HR 78 | Ht 62.0 in | Wt 150.0 lb

## 2021-05-23 DIAGNOSIS — Z113 Encounter for screening for infections with a predominantly sexual mode of transmission: Secondary | ICD-10-CM | POA: Diagnosis not present

## 2021-05-23 DIAGNOSIS — Z01419 Encounter for gynecological examination (general) (routine) without abnormal findings: Secondary | ICD-10-CM

## 2021-05-23 MED ORDER — LEVONORGESTREL-ETHINYL ESTRAD 0.15-30 MG-MCG PO TABS: 1.0000 | ORAL_TABLET | Freq: Every day | ORAL | 4 refills | Status: DC

## 2021-05-23 NOTE — Patient Instructions (Signed)

## 2021-05-24 LAB — CERVICOVAGINAL ANCILLARY ONLY
Chlamydia: NEGATIVE
Comment: NEGATIVE
Comment: NEGATIVE
Comment: NORMAL
Neisseria Gonorrhea: NEGATIVE
Trichomonas: NEGATIVE

## 2021-06-14 ENCOUNTER — Other Ambulatory Visit: Payer: Self-pay | Admitting: Obstetrics and Gynecology

## 2022-05-23 NOTE — Progress Notes (Signed)
26 y.o. G0P0000 Single Caucasian female here for annual exam.    If misses her pill by an hour, she has some bleeding.  Not interested in another method.   Has constipation.   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  States she is doing ok.  No partner change.   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  PCP: Guerry Bruin, MD   LMP: 04/29/22     Period Cycle (Days): 28 Period Duration (Days): 34-5 Menstrual Flow:  (starts light to heavy) Menstrual Control: Tampon, Maxi pad Dysmenorrhea: (!) Mild Dysmenorrhea Symptoms: Cramping, Diarrhea (constipation)     Sexually active: Yes.    The current method of family planning is OCP (estrogen/progesterone).    Exercising: Yes.     ~3x a week/weights Smoker:  no  Health Maintenance: Pap:   05-13-19 Neg History of abnormal Pap:  no MMG:  n/a Colonoscopy:  n/a BMD:   n/a  Result  n/a TDaP:  up to date Gardasil:   Received the first dosage only as her HSP was diagnosed around the same time after the first dosage HIV: 05-18-20 NR Hep C: 05-18-20 Neg Screening Labs:      reports that she has never smoked. She has never used smokeless tobacco. She reports current alcohol use. She reports that she does not use drugs.  Past Medical History:  Diagnosis Date   HSP (Henoch Schonlein purpura) (HCC)     Past Surgical History:  Procedure Laterality Date   RENAL BIOPSY Left     Current Outpatient Medications  Medication Sig Dispense Refill   azelastine (ASTELIN) 0.1 % nasal spray Place 2 sprays into both nostrils 2 (two) times daily as needed.     clindamycin (CLEOCIN-T) 1 % external solution Apply topically 2 (two) times daily. 30 mL 0   fluticasone (FLONASE) 50 MCG/ACT nasal spray Place 1 spray into both nostrils 2 (two) times daily as needed.     levonorgestrel-ethinyl estradiol (LEVORA 0.15/30, 28,) 0.15-30 MG-MCG tablet Take 1 tablet by mouth daily. 84 tablet 4   Multiple Vitamin (MULTIVITAMIN)  capsule Take 1 capsule by mouth daily.     No current facility-administered medications for this visit.    Family History  Problem Relation Age of Onset   Atrial fibrillation Father    Atrial fibrillation Paternal Grandmother     Review of Systems  All other systems reviewed and are negative.   Exam:   BP 108/72   Pulse (!) 102   Ht 5\' 2"  (1.575 m)   Wt 158 lb (71.7 kg)   LMP 04/29/2022 (Exact Date)   SpO2 97%   BMI 28.90 kg/m     General appearance: alert, cooperative and appears stated age Head: normocephalic, without obvious abnormality, atraumatic Neck: no adenopathy, supple, symmetrical, trachea midline and thyroid normal to inspection and palpation Lungs: clear to auscultation bilaterally Breasts: normal appearance, no masses or tenderness, No nipple retraction or dimpling, No nipple discharge or bleeding, No axillary adenopathy Heart: regular rate and rhythm Abdomen: soft, non-tender; no masses, no organomegaly Extremities: extremities normal, atraumatic, no cyanosis or edema Skin: skin color, texture, turgor normal. No rashes or lesions Lymph nodes: cervical, supraclavicular, and axillary nodes normal. Neurologic: grossly normal  Pelvic: External genitalia: boils.  Has ulceration and tunnel noted.              No abnormal inguinal nodes palpated.              Urethra:  normal  appearing urethra with no masses, tenderness or lesions              Bartholins and Skenes: normal                 Vagina: normal appearing vagina with normal color and discharge, no lesions              Cervix: no lesions              Pap taken: yes Bimanual Exam:  Uterus:  normal size, contour, position, consistency, mobility, non-tender              Adnexa: no mass, fullness, tenderness     Chaperone was present for exam:  Ladona Ridgel, CMA  Assessment:   Well woman visit with gynecologic exam. Hx dysmenorrhea. Controlled on combined OCPs. Hx PCOS by hx. Henock Schonlein  purpura. Hidradenitis suppurativa.  Chronic.   Plan: Mammogram screening discussed. Self breast awareness reviewed. Pap and reflex HR HPV collected. Guidelines for Calcium, Vitamin D, regular exercise program including cardiovascular and weight bearing exercise. Refill of COCs. STD screening.  Referral to dermatology.   Follow up annually and prn.   After visit summary provided.

## 2022-05-24 ENCOUNTER — Ambulatory Visit (INDEPENDENT_AMBULATORY_CARE_PROVIDER_SITE_OTHER): Payer: No Typology Code available for payment source | Admitting: Obstetrics and Gynecology

## 2022-05-24 ENCOUNTER — Other Ambulatory Visit (HOSPITAL_COMMUNITY)
Admission: RE | Admit: 2022-05-24 | Discharge: 2022-05-24 | Disposition: A | Discharge status: Home / Self Care | Payer: Self-pay | Source: Ambulatory Visit | Attending: Obstetrics and Gynecology | Admitting: Obstetrics and Gynecology

## 2022-05-24 ENCOUNTER — Encounter: Payer: Self-pay | Admitting: Obstetrics and Gynecology

## 2022-05-24 VITALS — BP 108/72 | HR 102 | Ht 62.0 in | Wt 158.0 lb

## 2022-05-24 DIAGNOSIS — Z01419 Encounter for gynecological examination (general) (routine) without abnormal findings: Secondary | ICD-10-CM | POA: Diagnosis not present

## 2022-05-24 DIAGNOSIS — L732 Hidradenitis suppurativa: Secondary | ICD-10-CM | POA: Diagnosis not present

## 2022-05-24 DIAGNOSIS — Z113 Encounter for screening for infections with a predominantly sexual mode of transmission: Secondary | ICD-10-CM | POA: Insufficient documentation

## 2022-05-24 DIAGNOSIS — Z124 Encounter for screening for malignant neoplasm of cervix: Secondary | ICD-10-CM

## 2022-05-24 DIAGNOSIS — Z1159 Encounter for screening for other viral diseases: Secondary | ICD-10-CM

## 2022-05-24 DIAGNOSIS — Z114 Encounter for screening for human immunodeficiency virus [HIV]: Secondary | ICD-10-CM

## 2022-05-24 MED ORDER — LEVONORGESTREL-ETHINYL ESTRAD 0.15-30 MG-MCG PO TABS: 1.0000 | ORAL_TABLET | Freq: Every day | ORAL | 4 refills | Status: DC

## 2022-05-25 ENCOUNTER — Telehealth: Payer: Self-pay | Admitting: Obstetrics and Gynecology

## 2022-05-25 DIAGNOSIS — L732 Hidradenitis suppurativa: Secondary | ICD-10-CM | POA: Insufficient documentation

## 2022-05-25 NOTE — Telephone Encounter (Signed)
Referral faxed successfully.  

## 2022-05-25 NOTE — Telephone Encounter (Signed)
Please assist in referral to Dr. Leonie Man at [facility] for hidradenitis suppurativa of the vulva.

## 2022-05-26 LAB — HEPATITIS C ANTIBODY: Hepatitis C Ab: NONREACTIVE

## 2022-05-26 LAB — HIV ANTIBODY (ROUTINE TESTING W REFLEX): HIV 1&2 Ab, 4th Generation: NONREACTIVE

## 2022-05-26 LAB — RPR: RPR Ser Ql: NONREACTIVE

## 2022-05-28 LAB — CYTOLOGY - PAP
Chlamydia: NEGATIVE
Comment: NEGATIVE
Comment: NEGATIVE
Comment: NORMAL
Diagnosis: NEGATIVE
Neisseria Gonorrhea: NEGATIVE
Trichomonas: NEGATIVE

## 2022-05-31 NOTE — Telephone Encounter (Signed)
Left detailed msg on Suzanne Avery's VM (New pt coordinator @ [facility]) to f/u w/ pt's referral. Asked for her to give [place] a call back with info and advised her to leave detailed VM if no one is available to answer.

## 2022-06-13 NOTE — Telephone Encounter (Signed)
Spoke w/ scheduler @ [facility] and they reported that pt has not been scheduled yet but she could cal anytime to set up.   Spoke with pt and she said that unfortunately, she could only do Friday appts and plans to set one up with them within the next few weeks.   I requested if pt didn't mind giving [place] a call and letting us know when she made that appt. She agreed.

## 2022-07-05 NOTE — Telephone Encounter (Signed)
Encounter reviewed and closed.  

## 2022-07-11 NOTE — Telephone Encounter (Signed)
I called patient and left message voicemail to update the office about referral.

## 2022-08-21 NOTE — Telephone Encounter (Signed)
Patient called back and said she is scheduled with dermatology on 10/08/22 @ 9:30am.

## 2023-05-22 NOTE — Progress Notes (Deleted)
27 y.o. G0P0000 Single Caucasian female here for annual exam.    PCP:     No LMP recorded.           Sexually active: {yes no:314532}  The current method of family planning is OCP (estrogen/progesterone).    Exercising: {yes no:314532}  {types:19826} Smoker:  no  Health Maintenance: Pap:  05/24/22 neg, 05/13/19 neg History of abnormal Pap:  no MMG:  n/a Colonoscopy:  n/a BMD:   n/a  Result  n/a TDaP:  UTD Gardasil:   yes, Received the first dosage only as her HSP was diagnosed around the same time after the first dosage  HIV: 05/24/22 NR Hep C: 05/24/22 NR Screening Labs:  Hb today: ***, Urine today: ***   reports that she has never smoked. She has never used smokeless tobacco. She reports current alcohol use. She reports that she does not use drugs.  Past Medical History:  Diagnosis Date   HSP (Henoch Schonlein purpura) (HCC)     Past Surgical History:  Procedure Laterality Date   RENAL BIOPSY Left     Current Outpatient Medications  Medication Sig Dispense Refill   azelastine (ASTELIN) 0.1 % nasal spray Place 2 sprays into both nostrils 2 (two) times daily as needed.     clindamycin (CLEOCIN-T) 1 % external solution Apply topically 2 (two) times daily. 30 mL 0   fluticasone (FLONASE) 50 MCG/ACT nasal spray Place 1 spray into both nostrils 2 (two) times daily as needed.     levonorgestrel-ethinyl estradiol (LEVORA 0.15/30, 28,) 0.15-30 MG-MCG tablet Take 1 tablet by mouth daily. 84 tablet 4   Multiple Vitamin (MULTIVITAMIN) capsule Take 1 capsule by mouth daily.     No current facility-administered medications for this visit.    Family History  Problem Relation Age of Onset   Atrial fibrillation Father    Atrial fibrillation Paternal Grandmother     Review of Systems  Exam:   There were no vitals taken for this visit.    General appearance: alert, cooperative and appears stated age Head: normocephalic, without obvious abnormality, atraumatic Neck: no adenopathy,  supple, symmetrical, trachea midline and thyroid normal to inspection and palpation Lungs: clear to auscultation bilaterally Breasts: normal appearance, no masses or tenderness, No nipple retraction or dimpling, No nipple discharge or bleeding, No axillary adenopathy Heart: regular rate and rhythm Abdomen: soft, non-tender; no masses, no organomegaly Extremities: extremities normal, atraumatic, no cyanosis or edema Skin: skin color, texture, turgor normal. No rashes or lesions Lymph nodes: cervical, supraclavicular, and axillary nodes normal. Neurologic: grossly normal  Pelvic: External genitalia:  no lesions              No abnormal inguinal nodes palpated.              Urethra:  normal appearing urethra with no masses, tenderness or lesions              Bartholins and Skenes: normal                 Vagina: normal appearing vagina with normal color and discharge, no lesions              Cervix: no lesions              Pap taken: {yes no:314532} Bimanual Exam:  Uterus:  normal size, contour, position, consistency, mobility, non-tender              Adnexa: no mass, fullness, tenderness  Rectal exam: {yes no:314532}.  Confirms.              Anus:  normal sphincter tone, no lesions  Chaperone was present for exam:  ***  Assessment:   Well woman visit with gynecologic exam.   Plan: Mammogram screening discussed. Self breast awareness reviewed. Pap and HR HPV as above. Guidelines for Calcium, Vitamin D, regular exercise program including cardiovascular and weight bearing exercise.   Follow up annually and prn.   Additional counseling given.  {yes [id number]. _______ minutes face to face time of which over 50% was spent in counseling.    After visit summary provided.

## 2023-05-28 ENCOUNTER — Ambulatory Visit: Payer: No Typology Code available for payment source | Admitting: Obstetrics and Gynecology

## 2023-07-21 ENCOUNTER — Other Ambulatory Visit: Payer: Self-pay | Admitting: Obstetrics and Gynecology

## 2023-07-22 NOTE — Telephone Encounter (Signed)
Med refill request: [facility] Last AEX: 05/24/22 Next AEX: cancelled 05/28/23, 10/28/23 Last MMG (if hormonal med) n/a Refill authorized: Please Advise, #[id number], 0 RF

## 2023-08-12 ENCOUNTER — Other Ambulatory Visit: Payer: Self-pay | Admitting: Obstetrics and Gynecology

## 2023-08-12 DIAGNOSIS — Z3041 Encounter for surveillance of contraceptive pills: Secondary | ICD-10-CM

## 2023-08-12 NOTE — Telephone Encounter (Signed)
Med refill request: [facility] Last AEX: 05/24/2022-BS Next AEX: 10/28/2023 Last MMG (if hormonal med): n/a Refill authorized: rx pend.

## 2023-10-21 NOTE — Progress Notes (Signed)
27 y.o. G14P0000 Single Caucasian female here for annual exam.    No period concerns.  Can skip or miss a pill occasionally.  Thinks this is the best option for her.   Went skiing and fell, so she has bruising.   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   No partner change.  Engaged.  She declines STD screening.   She wants routine labs and vit D.  Feels tired a lot and decreased energy.   She has some stress.  She will start counseling after the new year and has a resource for this.  Denies suicidal ideation.   Taking vit D, B12, fish oil, zinc, and MVI.   Rash under her left breast.   PCP: Patient, No Pcp Per   Patient's last menstrual period was 10/31/2023.     Period Cycle (Days): 28 Period Duration (Days): 4-5 Period Pattern: Regular Menstrual Flow: Moderate Menstrual Control: Maxi pad, Tampon Dysmenorrhea: (!) Mild     Sexually active: Yes.    The current method of family planning is OCP.    Menopausal hormone therapy:  n/a Exercising: Yes.     2-3 times a week weight training Smoker:  no  OB History  Gravida Para Term Preterm AB Living  0 0 0 0 0 0  SAB IAB Ectopic Multiple Live Births  0 0 0 0 0     HEALTH MAINTENANCE: Last 2 paps:  05/24/22 neg, 05/13/19 neg History of abnormal Pap or positive HPV:  no Mammogram:   n/a Colonoscopy:  n/a Bone Density:  n/a  Result  n/a   Immunization History  Administered Date(s) Administered   Influenza Split 08/20/2013, 09/15/2014      reports that she has never smoked. She has never used smokeless tobacco. She reports current alcohol use. She reports that she does not use drugs.  Past Medical History:  Diagnosis Date   HSP (Henoch Schonlein purpura) (HCC)     Past Surgical History:  Procedure Laterality Date   RENAL BIOPSY Left     Current Outpatient Medications  Medication Sig Dispense Refill   azelastine (ASTELIN) 0.1 % nasal spray Place 2 sprays into both nostrils 2 (two)  times daily as needed.     clindamycin (CLEOCIN-T) 1 % external solution Apply topically 2 (two) times daily. 30 mL 0   fluticasone (FLONASE) 50 MCG/ACT nasal spray Place 1 spray into both nostrils 2 (two) times daily as needed.     KURVELO 0.15-30 MG-MCG tablet TAKE 1 TABLET BY MOUTH DAILY 84 tablet 0   Multiple Vitamin (MULTIVITAMIN) capsule Take 1 capsule by mouth daily.     No current facility-administered medications for this visit.    ALLERGIES: Amoxicillin-pot clavulanate, Ibuprofen, and Other  Family History  Problem Relation Age of Onset   Atrial fibrillation Father    Atrial fibrillation Paternal Grandmother     Review of Systems  All other systems reviewed and are negative.   PHYSICAL EXAM:  BP 110/68 (BP Location: Left Arm, Patient Position: Sitting, Cuff Size: Small)   Pulse 65   Ht 5\' 3"  (1.6 m)   Wt 164 lb (74.4 kg)   LMP 10/31/2023   SpO2 99%   BMI 29.05 kg/m     General appearance: alert, cooperative and appears stated age Head: normocephalic, without obvious abnormality, atraumatic Neck: no adenopathy, supple, symmetrical, trachea midline and thyroid normal to inspection and palpation Lungs: clear to auscultation bilaterally Breasts: right - normal appearance and  left with slit like redness under left breast, no masses or tenderness, No nipple retraction or dimpling, No nipple discharge or bleeding, No axillary adenopathy Heart: regular rate and rhythm Abdomen: soft, non-tender; no masses, no organomegaly Extremities: extremities normal, atraumatic, no cyanosis or edema Skin: skin color, texture, turgor normal. No rashes or lesions.. random bruising.  Lymph nodes: cervical, supraclavicular, and axillary nodes normal. Neurologic: grossly normal  Pelvic: External genitalia:  well healed boils.               No abnormal inguinal nodes palpated.              Urethra:  normal appearing urethra with no masses, tenderness or lesions              Bartholins and  Skenes: normal                 Vagina: normal appearing vagina with normal color and discharge, no lesions              Cervix: no lesions              Pap taken: no Bimanual Exam:  Uterus:  normal size, contour, position, consistency, mobility, non-tender              Adnexa: no mass, fullness, tenderness               Chaperone was present for exam:  Rosette Reveal, CMA  ASSESSMENT: Well woman visit with gynecologic exam Hx dysmenorrhea. Controlled on combined OCPs. Hx PCOS by hx. Henock Schonlein purpura. Hidradenitis suppurativa.  Chronic.  Fatigue.  Stress.  Candida of flexural fold.   PLAN: Mammogram screening age 40.  Self breast awareness reviewed. Pap and HRV collected:  no Guidelines for Calcium, Vitamin D, regular exercise program including cardiovascular and weight bearing exercise. Lipids, CBC, CMP, vit D, TSH Medication refills:  COCs for one year.  Rx for Nystatin powder.  Follow up:  1 year and prn.

## 2023-10-28 ENCOUNTER — Ambulatory Visit: Payer: No Typology Code available for payment source | Admitting: Obstetrics and Gynecology

## 2023-11-04 ENCOUNTER — Encounter: Payer: Self-pay | Admitting: Obstetrics and Gynecology

## 2023-11-04 ENCOUNTER — Ambulatory Visit (INDEPENDENT_AMBULATORY_CARE_PROVIDER_SITE_OTHER): Payer: No Typology Code available for payment source | Admitting: Obstetrics and Gynecology

## 2023-11-04 VITALS — BP 110/68 | HR 65 | Ht 63.0 in | Wt 164.0 lb

## 2023-11-04 DIAGNOSIS — Z3041 Encounter for surveillance of contraceptive pills: Secondary | ICD-10-CM

## 2023-11-04 DIAGNOSIS — Z0189 Encounter for other specified special examinations: Secondary | ICD-10-CM

## 2023-11-04 DIAGNOSIS — Z01419 Encounter for gynecological examination (general) (routine) without abnormal findings: Secondary | ICD-10-CM

## 2023-11-04 DIAGNOSIS — B372 Candidiasis of skin and nail: Secondary | ICD-10-CM

## 2023-11-04 MED ORDER — LEVONORGESTREL-ETHINYL ESTRAD 0.15-30 MG-MCG PO TABS: 1.0000 | ORAL_TABLET | Freq: Every day | ORAL | 4 refills | Status: AC

## 2023-11-04 MED ORDER — NYSTATIN 100000 UNIT/GM EX POWD: 1.0000 | Freq: Three times a day (TID) | CUTANEOUS | 2 refills | Status: AC

## 2023-11-04 NOTE — Patient Instructions (Signed)

## 2023-11-05 LAB — COMPREHENSIVE METABOLIC PANEL
AG Ratio: 1.3 (calc) (ref 1.0–2.5)
ALT: 19 U/L (ref 6–29)
AST: 16 U/L (ref 10–30)
Albumin: 4 g/dL (ref 3.6–5.1)
Alkaline phosphatase (APISO): 75 U/L (ref 31–125)
BUN: 10 mg/dL (ref 7–25)
CO2: 27 mmol/L (ref 20–32)
Calcium: 9.3 mg/dL (ref 8.6–10.2)
Chloride: 102 mmol/L (ref 98–110)
Creat: 0.72 mg/dL (ref 0.50–0.96)
Globulin: 3.1 g/dL (ref 1.9–3.7)
Glucose, Bld: 79 mg/dL (ref 65–99)
Potassium: 4.1 mmol/L (ref 3.5–5.3)
Sodium: 138 mmol/L (ref 135–146)
Total Bilirubin: 0.3 mg/dL (ref 0.2–1.2)
Total Protein: 7.1 g/dL (ref 6.1–8.1)

## 2023-11-05 LAB — LIPID PANEL
Cholesterol: 224 mg/dL — ABNORMAL HIGH (ref <200)
HDL: 72 mg/dL (ref >=50)
LDL Cholesterol (Calc): 127 mg/dL — ABNORMAL HIGH
Non-HDL Cholesterol (Calc): 152 mg/dL — ABNORMAL HIGH (ref <130)
Total CHOL/HDL Ratio: 3.1 (calc) (ref <5.0)
Triglycerides: 137 mg/dL (ref <150)

## 2023-11-05 LAB — CBC
HCT: 42.2 % (ref 35.0–45.0)
Hemoglobin: 13.5 g/dL (ref 11.7–15.5)
MCH: 29.3 pg (ref 27.0–33.0)
MCHC: 32 g/dL (ref 32.0–36.0)
MCV: 91.7 fL (ref 80.0–100.0)
MPV: 11.1 fL (ref 7.5–12.5)
Platelets: 284 10*3/uL (ref 140–400)
RBC: 4.6 10*6/uL (ref 3.80–5.10)
RDW: 11.8 % (ref 11.0–15.0)
WBC: 6.7 10*3/uL (ref 3.8–10.8)

## 2023-11-05 LAB — TSH: TSH: 1.3 m[IU]/L

## 2023-11-05 LAB — VITAMIN D 25 HYDROXY (VIT D DEFICIENCY, FRACTURES): Vit D, 25-Hydroxy: 33 ng/mL (ref 30–100)

## 2024-11-09 ENCOUNTER — Ambulatory Visit: Payer: No Typology Code available for payment source | Admitting: Obstetrics and Gynecology
